# Patient Record
Sex: Male | Born: 1985 | Race: Black or African American | Hispanic: No | Marital: Single | Smoking: Current some day smoker
Health system: Southern US, Community
[De-identification: ages and names within clinical notes are randomized; demographics above are authoritative.]

## PROBLEM LIST (undated history)

## (undated) HISTORY — PX: NO PAST SURGERIES: SHX2092

---
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
## 2008-06-15 ENCOUNTER — Emergency Department: Payer: Self-pay | Admitting: Internal Medicine

## 2012-04-27 ENCOUNTER — Ambulatory Visit: Payer: Self-pay | Admitting: Family Medicine

## 2012-04-27 LAB — RAPID STREP-A WITH REFLX: Micro Text Report: NEGATIVE

## 2012-04-29 LAB — BETA STREP CULTURE(ARMC)

## 2012-09-19 ENCOUNTER — Ambulatory Visit: Payer: Self-pay

## 2013-04-08 ENCOUNTER — Inpatient Hospital Stay: Payer: Self-pay | Admitting: Internal Medicine

## 2013-04-08 LAB — CBC
HGB: 15.9 g/dL (ref 13.0–18.0)
MCH: 30.7 pg (ref 26.0–34.0)
MCHC: 34.5 g/dL (ref 32.0–36.0)
RBC: 5.18 10*6/uL (ref 4.40–5.90)
WBC: 16.1 10*3/uL — ABNORMAL HIGH (ref 3.8–10.6)

## 2013-04-08 LAB — DRUG SCREEN, URINE
Amphetamines, Ur Screen: NEGATIVE (ref ?–1000)
Barbiturates, Ur Screen: NEGATIVE (ref ?–200)
Benzodiazepine, Ur Scrn: NEGATIVE (ref ?–200)
Cannabinoid 50 Ng, Ur ~~LOC~~: POSITIVE (ref ?–50)
Cocaine Metabolite,Ur ~~LOC~~: NEGATIVE (ref ?–300)
MDMA (Ecstasy)Ur Screen: NEGATIVE (ref ?–500)
Opiate, Ur Screen: NEGATIVE (ref ?–300)
Tricyclic, Ur Screen: NEGATIVE (ref ?–1000)

## 2013-04-08 LAB — BASIC METABOLIC PANEL
BUN: 12 mg/dL (ref 7–18)
Calcium, Total: 8.8 mg/dL (ref 8.5–10.1)
Chloride: 103 mmol/L (ref 98–107)
Co2: 30 mmol/L (ref 21–32)
Creatinine: 0.71 mg/dL (ref 0.60–1.30)
EGFR (Non-African Amer.): >60
Glucose: 94 mg/dL (ref 65–99)
Potassium: 4 mmol/L (ref 3.5–5.1)

## 2013-04-08 LAB — CK: CK, Total: 93 U/L (ref 35–232)

## 2013-04-08 LAB — TROPONIN I: Troponin-I: 0.09 ng/mL — ABNORMAL HIGH

## 2013-04-08 LAB — CK-MB: CK-MB: 1.8 ng/mL (ref 0.5–3.6)

## 2013-04-09 LAB — CBC WITH DIFFERENTIAL/PLATELET
Basophil #: 0 10*3/uL (ref 0.0–0.1)
Eosinophil #: 0 10*3/uL (ref 0.0–0.7)
HCT: 45.5 % (ref 40.0–52.0)
HGB: 15.9 g/dL (ref 13.0–18.0)
MCHC: 34.8 g/dL (ref 32.0–36.0)
Monocyte #: 0.1 x10 3/mm — ABNORMAL LOW (ref 0.2–1.0)
Monocyte %: 0.7 %
Neutrophil #: 8.8 10*3/uL — ABNORMAL HIGH (ref 1.4–6.5)
Neutrophil %: 91.9 %
Platelet: 166 10*3/uL (ref 150–440)
WBC: 9.6 10*3/uL (ref 3.8–10.6)

## 2013-04-09 LAB — COMPREHENSIVE METABOLIC PANEL
Albumin: 3.4 g/dL (ref 3.4–5.0)
Alkaline Phosphatase: 53 U/L
BUN: 11 mg/dL (ref 7–18)
Calcium, Total: 8.6 mg/dL (ref 8.5–10.1)
Chloride: 107 mmol/L (ref 98–107)
Co2: 28 mmol/L (ref 21–32)
Creatinine: 0.76 mg/dL (ref 0.60–1.30)
EGFR (Non-African Amer.): >60
Glucose: 151 mg/dL — ABNORMAL HIGH (ref 65–99)
Osmolality: 278 (ref 275–301)
Potassium: 4.4 mmol/L (ref 3.5–5.1)
SGOT(AST): 21 U/L (ref 15–37)
Sodium: 138 mmol/L (ref 136–145)
Total Protein: 7 g/dL (ref 6.4–8.2)

## 2013-04-09 LAB — CK TOTAL AND CKMB (NOT AT ARMC)
CK, Total: 81 U/L (ref 35–232)
CK, Total: 90 U/L (ref 35–232)
CK-MB: 1 ng/mL (ref 0.5–3.6)
CK-MB: 0.8 ng/mL (ref 0.5–3.6)

## 2013-04-09 LAB — TROPONIN I: Troponin-I: 0.06 ng/mL — ABNORMAL HIGH

## 2014-03-15 ENCOUNTER — Ambulatory Visit: Payer: Self-pay | Admitting: Emergency Medicine

## 2014-08-03 NOTE — Consult Note (Signed)
PATIENT NAME:  Thomas CanterburyBLACKWELL, Pattrick J MR#:  [id number] DATE OF BIRTH:  [birth year]  DATE OF CONSULTATION:  04/08/2013  REFERRING PHYSICIAN:   CONSULTING PHYSICIAN:  Adah Salvageichard E. Excell Seltzerooper, MD  CHIEF COMPLAINT:  Left chest pain anteriorly.   HISTORY OF PRESENT ILLNESS:  I was called by Dr. Judithann SheenSparks to see this patient who had a pneumothorax identified on CT scan.  History was obtained from Dr. Judithann SheenSparks and from the patient.  The patient describes the acute onset of left chest pain anteriorly, points near his sternal border stating that it started yesterday while smoking cigarettes.  He states he has had something like this before, every year this time of year he states he has this sort of pain, but never this bad and in fact his mother corrects him and states that he had been diagnosed as bronchitis the last time this happened.  He denies sputum production and denies active shortness of breath at this time.   PAST MEDICAL HISTORY:  None.   PAST SURGICAL HISTORY:  None.   ALLERGIES:  None.   MEDICATIONS:  None.   FAMILY HISTORY:  Noncontributory.   SOCIAL HISTORY:  The patient smokes cigarettes, but does not drink.  He works at [employer].   REVIEW OF SYSTEMS:  A 10 system review is performed and negative with the exception of that mentioned in the HPI.   PHYSICAL EXAMINATION: GENERAL:  Comfortable-appearing tall, thin black male.  VITAL SIGNS:  Stable.  HEENT:  Shows no scleral icterus.  NECK:  No palpable neck nodes.  CHEST:  Clear to auscultation, good breath sounds in all lung fields both anteriorly and posteriorly.  ABDOMEN:  Soft, nontender.  CARDIAC:  Regular rate and rhythm.  EXTREMITIES:  Without edema.  NEUROLOGIC:  Grossly intact.  INTEGUMENT:  No jaundice.   LABORATORY DATA:  Chest x-ray and CT scan are personally reviewed, what is not visible on this chest x-ray is visible on the CT scan, anteriorly on the left is a very small rim of pneumothorax.  Multiple small blebs are noted.   Laboratory values are reviewed.   ASSESSMENT AND PLAN:  Small left pneumothorax, would not benefit from chest tube insertion at this point.  We will observe and repeat chest x-ray in the morning.  I discussed this with Dr. Judithann SheenSparks and with the family.  The  patient understood and agreed with this plan.   ____________________________ Adah Salvageichard E. Excell Seltzerooper, MD rec:ea D: 04/08/2013 23:53:28 ET T: 04/09/2013 03:02:09 ET JOB#: [id number]  cc: Adah Salvageichard E. Excell Seltzerooper, MD, <Dictator> Lattie HawICHARD E Gael Londo MD ELECTRONICALLY SIGNED 04/09/2013 6:43

## 2014-08-03 NOTE — Consult Note (Signed)
Brief Consult Note: Diagnosis: lt ptx.   Patient was seen by consultant.   Consult note dictated.   Recommend further assessment or treatment.   Orders entered.   Discussed with Attending MD.   Comments: Small lt ptx not visible on plain cxr, seen only on ct anteriorly. multiple small blebs. no prior episode. observe, repeat CXR and use suppl O2.  Electronic Signatures: Lattie Hawooper, Richard E (MD)  (Signed 27-Dec-14 23:48)  Authored: Brief Consult Note   Last Updated: 27-Dec-14 23:48 by Lattie Hawooper, Richard E (MD)

## 2014-08-03 NOTE — H&P (Signed)
PATIENT NAME:  [name], [name] MR#:  [id number] DATE OF BIRTH:  12-20-1985  DATE OF ADMISSION:  04/08/2013  REFERRING PHYSICIAN:  Dr. Mayford KnifeWilliams.   FAMILY PHYSICIAN:  None.   REASON FOR ADMISSION:  Chest pain.   HISTORY OF PRESENT ILLNESS:  The patient is a 29 year old male with no significant past medical history who presents with a two-day history of progressive chest pain, worse with deep inspiration.  Denies trauma.  Does have a family history of coronary artery disease.  In the Emergency Room, the patient was noted to have an abnormal EKG with an elevated troponin.  He is now admitted for further evaluation.   PAST MEDICAL HISTORY:  Unremarkable.   MEDICATIONS:  None.   ALLERGIES:  No known drug allergies.   SOCIAL HISTORY:  The patient does smoke cigarettes.  Denies alcohol abuse.  Denies illicit drug use.   FAMILY HISTORY:  Positive for coronary artery disease and diabetes, but otherwise unremarkable.   REVIEW OF SYSTEMS:  CONSTITUTIONAL:  No fever or change in weight.  EYES:  No blurred or double vision.  No glaucoma.  EARS, NOSE, THROAT:  No tinnitus or hearing loss.  No nasal discharge or bleeding.  No difficulty swallowing.  RESPIRATORY:  No cough or wheezing.  Denies hemoptysis.  CARDIOVASCULAR:  No orthopnea or palpitations.  No syncope.  GASTROINTESTINAL:  No nausea, vomiting, or diarrhea.  No abdominal pain.  No change in bowel habits.  GENITOURINARY:  No dysuria or hematuria.  No incontinence.  ENDOCRINE:  No polyuria or polydipsia.  No heat or cold intolerance.  HEMATOLOGIC:  The patient denies anemia, easy bruising, or bleeding.  LYMPHATIC:  No swollen glands.  MUSCULOSKELETAL:  The patient denies pain in his neck, back, shoulders, knees, or hips.  No gout.  NEUROLOGIC:  No numbness or weakness.  Denies migraines, stroke or seizures.  PSYCHIATRIC:  The patient denies anxiety, insomnia or depression.   PHYSICAL EXAMINATION: GENERAL:  The patient is in no acute  distress.  VITAL SIGNS:  Remarkable for a blood pressure of 155/92, with a heart rate of 79, respiratory rate of 18.  Temperature of 98.1.  HEENT:  Normocephalic, atraumatic.  Pupils equally round and reactive to light and accommodation.  Extraocular movements are intact.  Sclerae are anicteric.  Conjunctivae are clear.  Oropharynx is clear.  NECK:  Supple without JVD or bruits.  No adenopathy or thyromegaly is noted.  LUNGS:  Clear to auscultation and percussion without wheezes, rales or rhonchi.  No dullness.  Respiratory effort is normal.  CARDIAC:  Regular rate and rhythm with normal S1, S2.  There is a 2 over 6 systolic murmur noted throughout the precordium.  No rubs or gallops are present.  PMI is nondisplaced.  Chest wall is nontender.  ABDOMEN:  Soft, nontender with normoactive bowel sounds.  No organomegaly or masses were appreciated.  No hernias or bruits were noted.  EXTREMITIES:  Without clubbing, cyanosis, edema.  Pulses were 2+ bilaterally.  SKIN:  Warm and dry without rash or lesions.  NEUROLOGIC:  Cranial nerves II through XII grossly intact.  Deep tendon reflexes were symmetric.  Motor and sensory exam is nonfocal.  PSYCHIATRIC:  Revealed a patient who is alert and oriented to person, place and time.  He was cooperative and used good judgment.   LABORATORY DATA:  Chest x-ray was unremarkable.  EKG revealed sinus rhythm with diffuse ST elevation consistent with early repolarization versus pericarditis.  Sed rate was 1.  White  count was 16.1 with a hemoglobin of 15.9.  Troponin was 0.09 with a total CK of 93 and an MB of 1.8.  Glucose 94 with a BUN of 12, creatinine 0.71, sodium 137 and a potassium of 4.0.   ASSESSMENT: 1.  Chest pain associated with shortness of breath.  2.  Abnormal EKG.  3.  Elevated troponin.  4.  Family history of coronary artery disease.  5.  Borderline hypertension.   PLAN:  The patient will be admitted to telemetry with empiric IV fluids, IV steroids, IV  antibiotics and topical nitrates.  We will obtain a CT of the chest at this time before he gets moved to the floor.  We will follow serial cardiac enzymes and obtain a cardiology consult.  We will also perform an echocardiogram.  Follow up routine labs in the morning.  Vital signs q. 4 hours.  Further treatment and evaluation will depend upon the patient's progress.   Total time spent on this patient was 50 minutes.    ____________________________ Duane Lope Judithann Sheen, MD jds:ea D: 04/08/2013 21:51:07 ET T: 04/08/2013 23:22:21 ET JOB#: [id number]  cc: Duane Lope. Judithann Sheen, MD, <Dictator> Colandra Ohanian Rodena Medin MD ELECTRONICALLY SIGNED 04/09/2013 0:43

## 2014-08-04 NOTE — Consult Note (Signed)
PATIENT NAME:  [name], [name] DATE OF BIRTH:  06/08/1985  DATE OF CONSULTATION:  04/09/2013  CONSULTING PHYSICIAN:  Ziyad Dyar D. Juliann Paresallwood, MD  REFERRING PHYSICIAN: Dr. Mayford KnifeWilliams  INDICATION: Chest pain, possible pericarditis.   HISTORY OF PRESENT ILLNESS: The patient is a 29 year old black male with significant past medical history of two days of progressive chest pain, worse with deep inspiration and lying down. The patient denies any trauma. Does have some history of coronary artery disease came to the Emergency Room. Also had an abnormal EKG and then was subsequently advised to be admitted for further evaluation and monitoring. The patient did have an abnormal EKG, and slightly elevated troponin, admitted for possible sleep apnea.   PAST MEDICAL HISTORY: Unremarkable.   MEDICATIONS: None.   ALLERGIES: None.   SOCIAL HISTORY: No smoking or alcohol consumption. No illicit drug use.   FAMILY HISTORY: Positive for coronary artery disease and diabetes.  Hypertension.   REVIEW OF SYSTEMS: No blackouts, syncope. No nausea or vomiting. No fever, chills, sweats. No weight loss, weight gain, hemoptysis, hematemesis. No bright red blood per rectum. No vision or change in sputum production or cough.   PHYSICAL EXAMINATION: VITAL SIGNS: Blood pressure 150/90, pulse 80, respiratory rate 16, afebrile.  HEENT: Normocephalic, atraumatic. Pupils equal to light.  NECK: Supple. No JVD.  LUNGS: Clear to auscultation and percussion.  wheeze, rhonchi, rale. HEART: Regular rate and rhythm. Systolic ejection murmur left sternal border. No S4. No S3. No significant rales.  ABDOMEN: Positive bowel sounds. No rebound, guarding or tenderness.  EXTREMITIES: Within normal limits. No cyanosis, clubbing, or edema.  NEUROLOGIC: Intact.  SKIN: Normal.   LABORATORIES:   Chest x-ray possible mild pneumothorax.  EKG: Normal sinus rhythm, diffuse ST changes,   Sedimentation rate was 1, white  count of 16, hemoglobin of 59. Troponin 0.09, CK and MB were normal. Glucose 94, BUN of 12, creatinine of 0.71, sodium 137, potassium 4.0.   ASSESSMENT: Abnormal EKG, possible pericarditis, chest pain, elevated troponin, possible pneumothorax.    PLAN: Agree with admit to telemetry. Rule out for myocardial infarction. Follow up cardiac enzymes. Follow-up EKG. Recommend pain control, IV fluids. Recommend NSAIDs not necessarily would recommend steroids at this point. Echocardiogram will be helpful with evaluation pericarditis , do not recommend cardiac catheterization. Do not recommend invasive study. Recommend evaluation for pneumothorax, which appears to be spontaneous. Would recommend just pain control with Tylenol and NSAIDS for now. Have the patient followup with cardiologist in outpatient provided troponins stay reasonably low.     ____________________________ Bobbie Stackwayne D. Juliann Paresallwood, MD ddc:sg D: 04/10/2013 14:18:16 ET T: 04/10/2013 14:46:01 ET JOB#: [id number]  cc: Kj Imbert D. Juliann Paresallwood, MD, <Dictator> Alwyn PeaWAYNE D Latresha Yahr MD ELECTRONICALLY SIGNED 05/11/2013 16:20

## 2014-08-04 NOTE — Discharge Summary (Signed)
PATIENT NAME:  [name] MR#:  [id number] DATE OF BIRTH:  Jan 14, 1986  DATE OF ADMISSION:  04/08/2013 DATE OF DISCHARGClyde Canterbury:  04/09/2013  ADMITTING DIAGNOSIS: Chest pain.   DISCHARGE DIAGNOSES:  1.  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 2.  Cannabis and tobacco abuse.  3.  Elevated troponin, no acute coronary syndrome.  4.  Hyperglycemia.  5.  Elevated blood pressure with no diagnosis of hypertension, likely pain related.   DISCHARGE CONDITION: Stable.   DISCHARGE MEDICATIONS: Nitroglycerin 0.4 mg sublingually every 5 minutes as needed,  aspirin 81 mg p.o. daily, Combivent Respimat 1 puff 4 times daily as needed, prednisone 50 mg p.o. once on the 29th of December 2014, then taper by 10 mg daily until stopped,  Nicotine  oral inhaler one puff every 1 to 2 hours as needed, nicotine transdermal patch 14 mg topically daily and Zithromax 500 mg p.o. daily for 5 more days.   HOME OXYGEN: None.   DIET: 2 gram salt, low fat, low cholesterol. Regular consistency.    DISCHARGE ACTIVITY: As tolerated.    FOLLOWUP APPOINTMENTS: With pulmonary in 2 days after discharge. Cardiologist, Dr. Juliann Paresallwood in 2 days after discharge as well as Dr. Excell Seltzerooper in 2 days after discharge.   CONSULTANTS: Dr. Excell Seltzerooper and Dr. Michela PitcherEly, care management and social work.   RADIOLOGIC STUDIES: Chest x-ray, PA and lateral, the 22nd of December 2014, revealed normal and stable examination. CT angiogram revealed chest with rule out pulmonary embolism on the 27th of December 2014, showed no evidence of pulmonary embolus. Small left-sided pneumothorax was seen. Scattered blebs were noted within both lungs. Chest x-ray, PA and lateral, the 28th of  December 2014, revealed persistent small left apical pneumothorax, which was unchanged.   HOSPITAL COURSE: The patient is a 29 year old African American male with history of tobacco abuse, who  presented to the hospital with complaints of chest pain. Please refer to Dr. Judithann SheenSparks admission note on the 27th of December 2014. On arrival to the hospital, the patient was complaining of left-sided chest pain. Denied any trauma. It was worse with inspiration. The patient's vital signs were remarkable for blood pressure 155/92, heart rate was 79, respiration rate was 18 and temperature was 98.1. Physical exam was unremarkable. The patient's lab data done on admission on the 27th of December, 2014, revealed normal BMP. The patient's cardiac enzymes showed elevation of troponin to 0.09. Urine drug screen was positive for cannabinoids. White blood cell count was elevated to 16.1, hemoglobin was 15.9 and platelet count was 153. Erythrocyte sedimentation rate was normal at 1. The patient had a chest x-ray done and was admitted to the hospital for further evaluation. Consultation with Dr. Excell Seltzerooper surgeon was obtained on the 27th of December, 2014, due to a small left pneumothorax. According to Dr. Excell Seltzerooper, who saw the patient the same day, the 27th of December, there was a small left pneumothorax, which was not visible on plain chest x-ray; however, seen on CT with multiple small dark blood. He recommended to observe the patient and repeat chest x-ray in the morning. Continue supplemental oxygen therapy for now if needed. The patient had a repeated chest x-ray done the 28th of December, 2014. He was referred by Dr. Michela PitcherEly, who felt that patient's lungs did not show any further collapse and that was discussed with the patient as well as family as well as advised smoking cessation. The patient is to  follow up with Dr. Michela Pitcher or Dr. Excell Seltzer in the next few days after discharge for further recommendations. Meanwhile, since he has significant labs, it was felt that the patient would benefit from Combivent, prednisone taper and  off the Zithromax. For his nicotine abuse, he was recommended nicotine replacement therapy and he was  agreeable. In regards to elevated troponin, the patient had echocardiogram done, which was read by Dr. Juliann Pares. Echocardiogram results however are not available yet at the time of dictation. The patient is to follow up with cardiologist in the next few days after discharge. It was felt that the patient's left-sided chest pain was related to small pneumothorax. On the day of discharge, the patient's vital signs were stable with temperature of 98.1, pulse was 74, respirations was 18, blood pressure 128/69, saturation was 96% to 99% on room air at rest.   TIME SPENT: 40 minutes on the patient.  ____________________________ Katharina Caper, MD rv:aw D: 04/09/2013 17:29:19 ET T: 04/10/2013 06:44:37 ET JOB#: [id number]  cc: Katharina Caper, MD, <Dictator> Dwayne D. Juliann Pares, MD DR. Olivia Canter MD ELECTRONICALLY SIGNED 04/24/2013 14:33

## 2015-01-17 ENCOUNTER — Encounter: Payer: Self-pay | Admitting: Emergency Medicine

## 2015-01-17 ENCOUNTER — Ambulatory Visit: Payer: 59

## 2015-01-17 ENCOUNTER — Ambulatory Visit
Admission: EM | Admit: 2015-01-17 | Discharge: 2015-01-17 | Disposition: A | Discharge status: Home / Self Care | Payer: 59 | Attending: Family Medicine | Admitting: Family Medicine

## 2015-01-17 DIAGNOSIS — J069 Acute upper respiratory infection, unspecified: Secondary | ICD-10-CM

## 2015-01-17 MED ORDER — HYDROCOD POLST-CPM POLST ER 10-8 MG/5ML PO SUER: 5.0000 mL | Freq: Two times a day (BID) | ORAL | Status: DC

## 2015-01-17 NOTE — ED Provider Notes (Signed)
CSN: [id number]     Arrival date & time 01/17/15  1636 History   First MD Initiated Contact with Patient 01/17/15 1714     Chief Complaint  Patient presents with  . Cough   (Consider location/radiation/quality/duration/timing/severity/associated sxs/prior Treatment) HPI    29 year old male who presents with a cough productive of mild green sputum and chest congestion and drainage for 2 days. So having some nasal drainage as well. He denies any fever but has had some chills.   History reviewed. No pertinent past medical history. Past Surgical History  Procedure Laterality Date  . No past surgeries     No family history on file. Social History  Substance Use Topics  . Smoking status: Former Games developer  . Smokeless tobacco: None  . Alcohol Use: Yes    Review of Systems  Constitutional: Positive for chills. Negative for fever, diaphoresis and fatigue.  HENT: Positive for congestion, postnasal drip, rhinorrhea, sneezing and sore throat.   Respiratory: Positive for cough.   All other systems reviewed and are negative.   Allergies  Review of patient's allergies indicates no known allergies.  Home Medications   Prior to Admission medications   Medication Sig Start Date End Date Taking? Authorizing Provider  chlorpheniramine-HYDROcodone (TUSSIONEX PENNKINETIC ER) 10-8 MG/5ML SUER Take 5 mLs by mouth 2 (two) times daily. 01/17/15   Lutricia Feil, PA-C   Meds Ordered and Administered this Visit  Medications - No data to display  BP 136/84 mmHg  Pulse 108  Temp(Src) 99.6 F (37.6 C) (Tympanic)  Resp 18  Ht 6' (1.829 m)  Wt 170 lb (77.111 kg)  BMI 23.05 kg/m2  SpO2 95% No data found.   Physical Exam  Constitutional: He is oriented to person, place, and time. He appears well-developed and well-nourished. No distress.  HENT:  Head: Normocephalic and atraumatic.  Right Ear: External ear normal.  Left Ear: External ear normal.  Mouth/Throat: Oropharynx is clear and moist.   Eyes: Pupils are equal, round, and reactive to light.  Neck: Neck supple.  Pulmonary/Chest: Effort normal and breath sounds normal.  Patient has some decreased sounds in the bases right more than left  Musculoskeletal: Normal range of motion. He exhibits no edema or tenderness.  Neurological: He is alert and oriented to person, place, and time.  Skin: Skin is warm and dry. He is not diaphoretic.  Psychiatric: He has a normal mood and affect. His behavior is normal. Judgment and thought content normal.  Nursing note and vitals reviewed.   ED Course  Procedures (including critical care time)  Labs Review Labs Reviewed - No data to display  Imaging Review Dg Chest 2 View  01/17/2015   CLINICAL DATA:  Productive cough for 2 days, congestion, former smoker  EXAM: CHEST  2 VIEW  COMPARISON:  04/09/2013  FINDINGS: Normal heart size, mediastinal contours, and pulmonary vascularity.  Lungs demonstrate mild chronic hyperinflation but are otherwise clear.  No pulmonary infiltrate, pleural effusion or pneumothorax.  LEFT apex pneumothorax seen on previous exam resolved.  Bones unremarkable.  IMPRESSION: No acute abnormalities.   Electronically Signed   By: Ulyses Southward M.D.   On: 01/17/2015 17:41     Visual Acuity Review  Right Eye Distance:   Left Eye Distance:   Bilateral Distance:    Right Eye Near:   Left Eye Near:    Bilateral Near:         MDM   1. URI, acute    Discharge Medication List as  of 01/17/2015  5:58 PM    START taking these medications   Details  chlorpheniramine-HYDROcodone (TUSSIONEX PENNKINETIC ER) 10-8 MG/5ML SUER Take 5 mLs by mouth 2 (two) times daily., Starting 01/17/2015, Until Discontinued, Print       Plan: 1. Test/x-ray results and diagnosis reviewed with patient 2. rx as per orders; risks, benefits, potential side effects reviewed with patient 3. Recommend supportive treatment with fluids rest. Flonase BID 4. F/u prn if symptoms worsen or don't  improve    Lutricia Feil, PA-C 01/17/15 1805

## 2015-01-17 NOTE — ED Notes (Signed)
Cough, chest congestion, drainage for 2 days

## 2015-01-17 NOTE — ED Notes (Signed)
Patient transported to X-ray 

## 2015-01-17 NOTE — Discharge Instructions (Signed)
Upper Respiratory Infection, Adult °Most upper respiratory infections (URIs) are a viral infection of the air passages leading to the lungs. A URI affects the nose, throat, and upper air passages. The most common type of URI is nasopharyngitis and is typically referred to as "the common cold." °URIs run their course and usually go away on their own. Most of the time, a URI does not require medical attention, but sometimes a bacterial infection in the upper airways can follow a viral infection. This is called a secondary infection. Sinus and middle ear infections are common types of secondary upper respiratory infections. °Bacterial pneumonia can also complicate a URI. A URI can worsen asthma and chronic obstructive pulmonary disease (COPD). Sometimes, these complications can require emergency medical care and may be life threatening.  °CAUSES °Almost all URIs are caused by viruses. A virus is a type of germ and can spread from one person to another.  °RISKS FACTORS °You may be at risk for a URI if:  °· You smoke.   °· You have chronic heart or lung disease. °· You have a weakened defense (immune) system.   °· You are very young or very old.   °· You have nasal allergies or asthma. °· You work in crowded or poorly ventilated areas. °· You work in health care facilities or schools. °SIGNS AND SYMPTOMS  °Symptoms typically develop 2-3 days after you come in contact with a cold virus. Most viral URIs last 7-10 days. However, viral URIs from the influenza virus (flu virus) can last 14-18 days and are typically more severe. Symptoms may include:  °· Runny or stuffy (congested) nose.   °· Sneezing.   °· Cough.   °· Sore throat.   °· Headache.   °· Fatigue.   °· Fever.   °· Loss of appetite.   °· Pain in your forehead, behind your eyes, and over your cheekbones (sinus pain). °· Muscle aches.   °DIAGNOSIS  °Your health care provider may diagnose a URI by: °· Physical exam. °· Tests to check that your symptoms are not due to  another condition such as: °· Strep throat. °· Sinusitis. °· Pneumonia. °· Asthma. °TREATMENT  °A URI goes away on its own with time. It cannot be cured with medicines, but medicines may be prescribed or recommended to relieve symptoms. Medicines may help: °· Reduce your fever. °· Reduce your cough. °· Relieve nasal congestion. °HOME CARE INSTRUCTIONS  °· Take medicines only as directed by your health care provider.   °· Gargle warm saltwater or take cough drops to comfort your throat as directed by your health care provider. °· Use a warm mist humidifier or inhale steam from a shower to increase air moisture. This may make it easier to breathe. °· Drink enough fluid to keep your urine clear or pale yellow.   °· Eat soups and other clear broths and maintain good nutrition.   °· Rest as needed.   °· Return to work when your temperature has returned to normal or as your health care provider advises. You may need to stay home longer to avoid infecting others. You can also use a face mask and careful hand washing to prevent spread of the virus. °· Increase the usage of your inhaler if you have asthma.   °· Do not use any tobacco products, including cigarettes, chewing tobacco, or electronic cigarettes. If you need help quitting, ask your health care provider. °PREVENTION  °The best way to protect yourself from getting a cold is to practice good hygiene.  °· Avoid oral or hand contact with people with cold   symptoms.   °· Wash your hands often if contact occurs.   °There is no clear evidence that vitamin C, vitamin E, echinacea, or exercise reduces the chance of developing a cold. However, it is always recommended to get plenty of rest, exercise, and practice good nutrition.  °SEEK MEDICAL CARE IF:  °· You are getting worse rather than better.   °· Your symptoms are not controlled by medicine.   °· You have chills. °· You have worsening shortness of breath. °· You have brown or red mucus. °· You have yellow or brown nasal  discharge. °· You have pain in your face, especially when you bend forward. °· You have a fever. °· You have swollen neck glands. °· You have pain while swallowing. °· You have white areas in the back of your throat. °SEEK IMMEDIATE MEDICAL CARE IF:  °· You have severe or persistent: °¨ Headache. °¨ Ear pain. °¨ Sinus pain. °¨ Chest pain. °· You have chronic lung disease and any of the following: °¨ Wheezing. °¨ Prolonged cough. °¨ Coughing up blood. °¨ A change in your usual mucus. °· You have a stiff neck. °· You have changes in your: °¨ Vision. °¨ Hearing. °¨ Thinking. °¨ Mood. °MAKE SURE YOU:  °· Understand these instructions. °· Will watch your condition. °· Will get help right away if you are not doing well or get worse. °  °This information is not intended to replace advice given to you by your health care provider. Make sure you discuss any questions you have with your health care provider. °  °Document Released: 09/23/2000 Document Revised: 08/14/2014 Document Reviewed: 07/05/2013 °Elsevier Interactive Patient Education ©2016 [employer] ° °Cough, Adult °A cough helps to clear your throat and lungs. A cough may last only 2-3 weeks (acute), or it may last longer than 8 weeks (chronic). Many different things can cause a cough. A cough may be a sign of an illness or another medical condition. °HOME CARE °· Pay attention to any changes in your cough. °· Take medicines only as told by your doctor. °¨ If you were prescribed an antibiotic medicine, take it as told by your doctor. Do not stop taking it even if you start to feel better. °¨ Talk with your doctor before you try using a cough medicine. °· Drink enough fluid to keep your pee (urine) clear or pale yellow. °· If the air is dry, use a cold steam vaporizer or humidifier in your home. °· Stay away from things that make you cough at work or at home. °· If your cough is worse at night, try using extra pillows to raise your head up higher while you  sleep. °· Do not smoke, and try not to be around smoke. If you need help quitting, ask your doctor. °· Do not have caffeine. °· Do not drink alcohol. °· Rest as needed. °GET HELP IF: °· You have new problems (symptoms). °· You cough up yellow fluid (pus). °· Your cough does not get better after 2-3 weeks, or your cough gets worse. °· Medicine does not help your cough and you are not sleeping well. °· You have pain that gets worse or pain that is not helped with medicine. °· You have a fever. °· You are losing weight and you do not know why. °· You have night sweats. °GET HELP RIGHT AWAY IF: °· You cough up blood. °· You have trouble breathing. °· Your heartbeat is very fast. °  °This information is not intended to replace   advice given to you by your health care provider. Make sure you discuss any questions you have with your health care provider. °  °Document Released: 12/11/2010 Document Revised: 12/19/2014 Document Reviewed: 06/06/2014 °Elsevier Interactive Patient Education ©2016 [employer] ° °

## 2016-09-27 ENCOUNTER — Emergency Department: Payer: Self-pay

## 2016-09-27 ENCOUNTER — Emergency Department
Admission: EM | Admit: 2016-09-27 | Discharge: 2016-09-27 | Disposition: A | Discharge status: Home / Self Care | Payer: Self-pay | Attending: Student in an Organized Health Care Education/Training Program | Admitting: Student in an Organized Health Care Education/Training Program

## 2016-09-27 ENCOUNTER — Encounter: Payer: Self-pay | Admitting: Emergency Medicine

## 2016-09-27 DIAGNOSIS — Z79899 Other long term (current) drug therapy: Secondary | ICD-10-CM | POA: Insufficient documentation

## 2016-09-27 DIAGNOSIS — Z87891 Personal history of nicotine dependence: Secondary | ICD-10-CM | POA: Insufficient documentation

## 2016-09-27 DIAGNOSIS — E860 Dehydration: Secondary | ICD-10-CM | POA: Insufficient documentation

## 2016-09-27 DIAGNOSIS — R42 Dizziness and giddiness: Secondary | ICD-10-CM | POA: Insufficient documentation

## 2016-09-27 DIAGNOSIS — R197 Diarrhea, unspecified: Secondary | ICD-10-CM

## 2016-09-27 DIAGNOSIS — I480 Paroxysmal atrial fibrillation: Secondary | ICD-10-CM | POA: Insufficient documentation

## 2016-09-27 DIAGNOSIS — R112 Nausea with vomiting, unspecified: Secondary | ICD-10-CM

## 2016-09-27 LAB — COMPREHENSIVE METABOLIC PANEL
ALBUMIN: 4.9 g/dL (ref 3.5–5.0)
ALK PHOS: 47 U/L (ref 38–126)
ALT: 13 U/L — AB (ref 17–63)
ANION GAP: 7 (ref 5–15)
AST: 18 U/L (ref 15–41)
BILIRUBIN TOTAL: 0.8 mg/dL (ref 0.3–1.2)
BUN: 12 mg/dL (ref 6–20)
CO2: 26 mmol/L (ref 22–32)
CREATININE: 0.72 mg/dL (ref 0.61–1.24)
Calcium: 9.2 mg/dL (ref 8.9–10.3)
Chloride: 106 mmol/L (ref 101–111)
GFR calc Af Amer: >60 mL/min (ref >60)
GFR calc non Af Amer: >60 mL/min (ref >60)
GLUCOSE: 109 mg/dL — AB (ref 65–99)
Potassium: 4 mmol/L (ref 3.5–5.1)
SODIUM: 139 mmol/L (ref 135–145)
TOTAL PROTEIN: 8.1 g/dL (ref 6.5–8.1)

## 2016-09-27 LAB — URINALYSIS, COMPLETE (UACMP) WITH MICROSCOPIC
Bacteria, UA: NONE SEEN
Bilirubin Urine: NEGATIVE
GLUCOSE, UA: NEGATIVE mg/dL
Ketones, ur: NEGATIVE mg/dL
Leukocytes, UA: NEGATIVE
NITRITE: NEGATIVE
PH: 8 (ref 5.0–8.0)
Protein, ur: NEGATIVE mg/dL
SPECIFIC GRAVITY, URINE: 1.011 (ref 1.005–1.030)
Squamous Epithelial / LPF: NONE SEEN

## 2016-09-27 LAB — CBC
HCT: 51.3 % (ref 40.0–52.0)
Hemoglobin: 17.9 g/dL (ref 13.0–18.0)
MCH: 31.1 pg (ref 26.0–34.0)
MCHC: 34.8 g/dL (ref 32.0–36.0)
MCV: 89.3 fL (ref 80.0–100.0)
PLATELETS: 198 10*3/uL (ref 150–440)
RBC: 5.75 MIL/uL (ref 4.40–5.90)
RDW: 13.4 % (ref 11.5–14.5)
WBC: 18.1 10*3/uL — ABNORMAL HIGH (ref 3.8–10.6)

## 2016-09-27 LAB — LIPASE, BLOOD: Lipase: 29 U/L (ref 11–51)

## 2016-09-27 LAB — TROPONIN I
Troponin I: <0.03 ng/mL (ref <0.03)
Troponin I: <0.03 ng/mL (ref <0.03)

## 2016-09-27 LAB — URINE DRUG SCREEN, QUALITATIVE (ARMC ONLY)
AMPHETAMINES, UR SCREEN: NOT DETECTED
Barbiturates, Ur Screen: NOT DETECTED
Benzodiazepine, Ur Scrn: NOT DETECTED
COCAINE METABOLITE, UR ~~LOC~~: NOT DETECTED
Cannabinoid 50 Ng, Ur ~~LOC~~: POSITIVE — AB
MDMA (ECSTASY) UR SCREEN: NOT DETECTED
METHADONE SCREEN, URINE: NOT DETECTED
OPIATE, UR SCREEN: NOT DETECTED
PHENCYCLIDINE (PCP) UR S: NOT DETECTED
Tricyclic, Ur Screen: NOT DETECTED

## 2016-09-27 MED ORDER — SODIUM CHLORIDE 0.9 % IV BOLUS (SEPSIS)
1000.0000 mL | Freq: Once | INTRAVENOUS | Status: AC
  Administered 2016-09-27: 1000 mL via INTRAVENOUS

## 2016-09-27 MED ORDER — PROMETHAZINE HCL 12.5 MG PO TABS: 12.5000 mg | ORAL_TABLET | Freq: Four times a day (QID) | ORAL | 0 refills | Status: DC | PRN

## 2016-09-27 MED ORDER — PROMETHAZINE HCL 25 MG/ML IJ SOLN
12.5000 mg | Freq: Once | INTRAMUSCULAR | Status: AC
  Administered 2016-09-27: 12.5 mg via INTRAVENOUS
  Filled 2016-09-27: qty 1

## 2016-09-27 MED ORDER — SODIUM CHLORIDE 0.9 % IV BOLUS (SEPSIS)
500.0000 mL | Freq: Once | INTRAVENOUS | Status: AC
  Administered 2016-09-27: 500 mL via INTRAVENOUS

## 2016-09-27 NOTE — ED Triage Notes (Addendum)
Pt to ED via POV for N/V/D and dizziness. Pt states that vomiting and diarrhea started today. Pt reports that he has vomited 20-30 times and has had about 5 episodes of diarrhea.. Pt states that when going from sitting to standing he gets dizzy and feel like he is going to pass out. Pt is hypertensive in triage.   Blood pressure when sitting 164/109, pulse 77 Blood pressure 166/108 when standing, pulse 68

## 2016-09-27 NOTE — ED Provider Notes (Signed)
[facility] Emergency Department Provider Note    None    (approximate)  I have reviewed the triage vital signs and the nursing notes.   HISTORY  Chief Complaint Dizziness and Emesis    HPI RIGGINS CISEK is a 31 y.o. male who presents with chief complaint of dizziness that followed as numerous episodes of nonbloody nonbilious nausea vomiting since with 5 episodes of diarrhea. Is also started this morning. Has not been able to keep anything down. No fevers at home. Denies any recent sick contacts. Denies any significant alcohol use last night. Denies any abdominal pain except for when he is actively vomiting. States only sitting down he does not have any dizziness but when he stands up he feels that the room is spinning around that is about to pass out.He numbness or tingling.   History reviewed. No pertinent past medical history. FMH: no sudden cardiac death Past Surgical History:  Procedure Laterality Date  . NO PAST SURGERIES     There are no active problems to display for this patient.     Prior to Admission medications   Medication Sig Start Date End Date Taking? Authorizing Provider  chlorpheniramine-HYDROcodone (TUSSIONEX PENNKINETIC ER) 10-8 MG/5ML SUER Take 5 mLs by mouth 2 (two) times daily. 01/17/15   Lutricia Feil, PA-C  promethazine (PHENERGAN) 12.5 MG tablet Take 1 tablet (12.5 mg total) by mouth every 6 (six) hours as needed for nausea or vomiting. 09/27/16   Willy Eddy, MD    Allergies Patient has no known allergies.    Social History Social History  Substance Use Topics  . Smoking status: Former Games developer  . Smokeless tobacco: Never Used  . Alcohol use Yes    Review of Systems Patient denies headaches, rhinorrhea, blurry vision, numbness, shortness of breath, chest pain, edema, cough, abdominal pain, nausea, vomiting, diarrhea, dysuria, fevers, rashes or hallucinations unless otherwise stated above in  HPI. ____________________________________________   PHYSICAL EXAM:  VITAL SIGNS: Vitals:   09/27/16 1830 09/27/16 1900  BP: (!) 160/105 139/90  Pulse: 83 71  Resp: 17 14  Temp:      Constitutional: Alert and oriented. Well appearing and in no acute distress. Eyes: Conjunctivae are normal.  Head: Atraumatic. Nose: No congestion/rhinnorhea. Mouth/Throat: Mucous membranes are moist.   Neck: No stridor. Painless ROM.  Cardiovascular: Normal rate, regular rhythm. Grossly normal heart sounds.  Good peripheral circulation. Respiratory: Normal respiratory effort.  No retractions. Lungs CTAB. Gastrointestinal: Soft and nontender. No distention. No abdominal bruits. No CVA tenderness. Genitourinary:  Musculoskeletal: No lower extremity tenderness nor edema.  No joint effusions. Neurologic:  CN- intact.  No facial droop, Normal FNF.  Normal heel to shin.  Sensation intact bilaterally. Normal speech and language. No gross focal neurologic deficits are appreciated. No gait instability. Skin:  Skin is warm, dry and intact. No rash noted. Psychiatric: Mood and affect are normal. Speech and behavior are normal.  ____________________________________________   LABS (all labs ordered are listed, but only abnormal results are displayed)  Results for orders placed or performed during the hospital encounter of 09/27/16 (from the past 24 hour(s))  Lipase, blood     Status: None   Collection Time: 09/27/16  2:07 PM  Result Value Ref Range   Lipase 29 11 - 51 U/L  Comprehensive metabolic panel     Status: Abnormal   Collection Time: 09/27/16  2:07 PM  Result Value Ref Range   Sodium 139 135 - 145 mmol/L  Potassium 4.0 3.5 - 5.1 mmol/L   Chloride 106 101 - 111 mmol/L   CO2 26 22 - 32 mmol/L   Glucose, Bld 109 (H) 65 - 99 mg/dL   BUN 12 6 - 20 mg/dL   Creatinine, Ser 1.300.72 0.61 - 1.24 mg/dL   Calcium 9.2 8.9 - 86.510.3 mg/dL   Total Protein 8.1 6.5 - 8.1 g/dL   Albumin 4.9 3.5 - 5.0 g/dL   AST  18 15 - 41 U/L   ALT 13 (L) 17 - 63 U/L   Alkaline Phosphatase 47 38 - 126 U/L   Total Bilirubin 0.8 0.3 - 1.2 mg/dL   GFR calc non Af Amer >60 >60 mL/min   GFR calc Af Amer >60 >60 mL/min   Anion gap 7 5 - 15  CBC     Status: Abnormal   Collection Time: 09/27/16  2:07 PM  Result Value Ref Range   WBC 18.1 (H) 3.8 - 10.6 K/uL   RBC 5.75 4.40 - 5.90 MIL/uL   Hemoglobin 17.9 13.0 - 18.0 g/dL   HCT 78.451.3 69.640.0 - 29.552.0 %   MCV 89.3 80.0 - 100.0 fL   MCH 31.1 26.0 - 34.0 pg   MCHC 34.8 32.0 - 36.0 g/dL   RDW 28.413.4 13.211.5 - 44.014.5 %   Platelets 198 150 - 440 K/uL  Troponin I     Status: None   Collection Time: 09/27/16  2:07 PM  Result Value Ref Range   Troponin I <0.03 <0.03 ng/mL  Urinalysis, Complete w Microscopic     Status: Abnormal   Collection Time: 09/27/16  6:25 PM  Result Value Ref Range   Color, Urine YELLOW (A) YELLOW   APPearance CLEAR (A) CLEAR   Specific Gravity, Urine 1.011 1.005 - 1.030   pH 8.0 5.0 - 8.0   Glucose, UA NEGATIVE NEGATIVE mg/dL   Hgb urine dipstick SMALL (A) NEGATIVE   Bilirubin Urine NEGATIVE NEGATIVE   Ketones, ur NEGATIVE NEGATIVE mg/dL   Protein, ur NEGATIVE NEGATIVE mg/dL   Nitrite NEGATIVE NEGATIVE   Leukocytes, UA NEGATIVE NEGATIVE   RBC / HPF 6-30 0 - 5 RBC/hpf   WBC, UA 0-5 0 - 5 WBC/hpf   Bacteria, UA NONE SEEN NONE SEEN   Squamous Epithelial / LPF NONE SEEN NONE SEEN   Mucous PRESENT   Urine Drug Screen, Qualitative ([facility] only)     Status: Abnormal   Collection Time: 09/27/16  6:25 PM  Result Value Ref Range   Tricyclic, Ur Screen NONE DETECTED NONE DETECTED   Amphetamines, Ur Screen NONE DETECTED NONE DETECTED   MDMA (Ecstasy)Ur Screen NONE DETECTED NONE DETECTED   Cocaine Metabolite,Ur [place] NONE DETECTED NONE DETECTED   Opiate, Ur Screen NONE DETECTED NONE DETECTED   Phencyclidine (PCP) Ur S NONE DETECTED NONE DETECTED   Cannabinoid 50 Ng, Ur [place] POSITIVE (A) NONE DETECTED   Barbiturates, Ur Screen NONE DETECTED NONE DETECTED    Benzodiazepine, Ur Scrn NONE DETECTED NONE DETECTED   Methadone Scn, Ur NONE DETECTED NONE DETECTED  Troponin I     Status: None   Collection Time: 09/27/16  6:25 PM  Result Value Ref Range   Troponin I <0.03 <0.03 ng/mL   ____________________________________________  EKG My review and personal interpretation at Time:   16:09 Indication: dizziness  Rate: 90  Rhythm: afibr Axis: normal Other: concave upwards st elevation in precordial leads without reciprocal depressions  My review and personal interpretation at Time:   18:07 Indication: dizziness  Rate: 70  Rhythm: sinus Axis: normal Other: concave upwards st elevation in precordial leads without reciprocal depressions most consistent with BER ________________________  ____________________________________________  RADIOLOGY  I personally reviewed all radiographic images ordered to evaluate for the above acute complaints and reviewed radiology reports and findings.  These findings were personally discussed with the patient.  Please see medical record for radiology report.  ____________________________________________   PROCEDURES  Procedure(s) performed:  Procedures    Critical Care performed: no ____________________________________________   INITIAL IMPRESSION / ASSESSMENT AND PLAN / ED COURSE  Pertinent labs & imaging results that were available during my care of the patient were reviewed by me and considered in my medical decision making (see chart for details).  DDX: gastritis, dehydration, acs, pericarditis, boerhaaves, substance abuse    LAQUINCY EASTRIDGE is a 31 y.o. who presents to the ED with nausea vomiting and dizziness as described above. Patient afebrile and clinically very well-appearing. He denies any chest pain. EKG is concerning with some concave upward ST segment elevations in the precordial leads likely secondary to benign early re-pole given his habitus and lack of chest pain but we'll further risk  stratify with a troponin. Patient also with evidence of A. fib which could contribute to his dizziness. We'll check blood work to evaluate for any evidence of collection with abnormality. His abdominal exam is soft and benign.  The patient will be placed on continuous pulse oximetry and telemetry for monitoring.  Laboratory evaluation will be sent to evaluate for the above complaints.     Clinical Course as of Sep 28 1911  Sun Sep 27, 2016  1736 Patient reassessed. In no acute distress. She is to be going in and out of some sort of paroxysmal A. fib. His admitting to using marijuana and alcohol last night. Initial troponin is negative. Patient feels significantly improved. We'll further risk stratify with repeat troponin.  [PR]  1909 Patient reassessed. Now in sinus rhythm. Denies any chest pain or shortness of breath. He is tolerating oral hydration. We'll give him a referral for follow-up with cardiology consulted for nausea. Encouraged to abstain from alcohol or any other substances.  Have discussed with the patient and available family all diagnostics and treatments performed thus far and all questions were answered to the best of my ability. The patient demonstrates understanding and agreement with plan.   [PR]    Clinical Course User Index [PR] Willy Eddy, MD     ____________________________________________   FINAL CLINICAL IMPRESSION(S) / ED DIAGNOSES  Final diagnoses:  Paroxysmal atrial fibrillation (HCC)  Nausea vomiting and diarrhea  Dehydration      NEW MEDICATIONS STARTED DURING THIS VISIT:  New Prescriptions   PROMETHAZINE (PHENERGAN) 12.5 MG TABLET    Take 1 tablet (12.5 mg total) by mouth every 6 (six) hours as needed for nausea or vomiting.     Note:  This document was prepared using Dragon voice recognition software and may include unintentional dictation errors.    Willy Eddy, MD 09/27/16 [phone]

## 2020-09-10 ENCOUNTER — Encounter: Payer: Self-pay | Admitting: Emergency Medicine

## 2020-09-10 ENCOUNTER — Other Ambulatory Visit: Payer: Self-pay

## 2020-09-10 ENCOUNTER — Emergency Department
Admission: EM | Admit: 2020-09-10 | Discharge: 2020-09-10 | Disposition: A | Discharge status: Home / Self Care | Payer: 59 | Attending: Emergency Medicine | Admitting: Emergency Medicine

## 2020-09-10 ENCOUNTER — Emergency Department: Payer: 59

## 2020-09-10 DIAGNOSIS — Y9302 Activity, running: Secondary | ICD-10-CM | POA: Insufficient documentation

## 2020-09-10 DIAGNOSIS — Z87891 Personal history of nicotine dependence: Secondary | ICD-10-CM | POA: Insufficient documentation

## 2020-09-10 DIAGNOSIS — Y9289 Other specified places as the place of occurrence of the external cause: Secondary | ICD-10-CM | POA: Insufficient documentation

## 2020-09-10 DIAGNOSIS — S92534A Nondisplaced fracture of distal phalanx of right lesser toe(s), initial encounter for closed fracture: Secondary | ICD-10-CM

## 2020-09-10 DIAGNOSIS — W228XXA Striking against or struck by other objects, initial encounter: Secondary | ICD-10-CM | POA: Insufficient documentation

## 2020-09-10 MED ORDER — NAPROXEN 500 MG PO TABS: 500.0000 mg | ORAL_TABLET | Freq: Two times a day (BID) | ORAL | Status: AC

## 2020-09-10 NOTE — Discharge Instructions (Signed)
Read and follow discharge care read and follow discharge care instructions.  Keep toes buddy taped for least 2 weeks.
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
## 2021-04-06 ENCOUNTER — Telehealth: Payer: Self-pay | Admitting: Physician Assistant

## 2021-04-06 DIAGNOSIS — U071 COVID-19: Secondary | ICD-10-CM

## 2021-04-06 MED ORDER — PSEUDOEPH-BROMPHEN-DM 30-2-10 MG/5ML PO SYRP: 5.0000 mL | ORAL_SOLUTION | Freq: Four times a day (QID) | ORAL | 0 refills | Status: DC | PRN

## 2021-04-06 MED ORDER — FLUTICASONE PROPIONATE 50 MCG/ACT NA SUSP: 2.0000 | Freq: Every day | NASAL | 0 refills | Status: AC

## 2021-04-06 NOTE — Patient Instructions (Signed)
[name], thank you for joining Margaretann Loveless, PA-C for today's virtual visit.  While this provider is not your primary care provider (PCP), if your PCP is located in our provider database this encounter information will be shared with them immediately following your visit.  Consent: (Patient) [name] provided verbal consent for this virtual visit at the beginning of the encounter.  Current Medications:  Current Outpatient Medications:    brompheniramine-pseudoephedrine-DM 30-2-10 MG/5ML syrup, Take 5 mLs by mouth 4 (four) times daily as needed., Disp: 120 mL, Rfl: 0   fluticasone (FLONASE) 50 MCG/ACT nasal spray, Place 2 sprays into both nostrils daily., Disp: 16 g, Rfl: 0   naproxen (NAPROSYN) 500 MG tablet, Take 1 tablet (500 mg total) by mouth 2 (two) times daily with a meal., Disp: 20 tablet, Rfl: 00   Medications ordered in this encounter:  Meds ordered this encounter  Medications   brompheniramine-pseudoephedrine-DM 30-2-10 MG/5ML syrup    Sig: Take 5 mLs by mouth 4 (four) times daily as needed.    Dispense:  120 mL    Refill:  0    Order Specific Question:   Supervising Provider    Answer:   MILLER, BRIAN [[id number]]   fluticasone (FLONASE) 50 MCG/ACT nasal spray    Sig: Place 2 sprays into both nostrils daily.    Dispense:  16 g    Refill:  0    Order Specific Question:   Supervising Provider    Answer:   Hyacinth Meeker, BRIAN [[id number]]     *If you need refills on other medications prior to your next appointment, please contact your pharmacy*  Follow-Up: Call back or seek an in-person evaluation if the symptoms worsen or if the condition fails to improve as anticipated.  Other Instructions 10 Things You Can Do to Manage Your COVID-19 Symptoms at Home If you have possible or confirmed COVID-19 Stay home except to get medical care. Monitor your symptoms carefully. If your symptoms get worse, call your healthcare provider immediately. Get rest and stay  hydrated. If you have a medical appointment, call the healthcare provider ahead of time and tell them that you have or may have COVID-19. For medical emergencies, call 911 and notify the dispatch personnel that you have or may have COVID-19. Cover your cough and sneezes with a tissue or use the inside of your elbow. Wash your hands often with soap and water for at least 20 seconds or clean your hands with an alcohol-based hand sanitizer that contains at least 60% alcohol. As much as possible, stay in a specific room and away from other people in your home. Also, you should use a separate bathroom, if available. If you need to be around other people in or outside of the home, wear a mask. Avoid sharing personal items with other people in your household, like dishes, towels, and bedding. Clean all surfaces that are touched often, like counters, tabletops, and doorknobs. Use household cleaning sprays or wipes according to the label instructions. [email or web address] 10/27/2019 This information is not intended to replace advice given to you by your health care provider. Make sure you discuss any questions you have with your health care provider. Document Revised: 12/20/2020 Document Reviewed: 12/20/2020 Elsevier Patient Education  2022 [employer].    If you have been instructed to have an in-person evaluation today at a local Urgent Care facility, please use the link below. It will take you to a list of all of our available [facility], including address, phone number and hours of operation. Please do not delay care.  [facility]  If you or a family member do not have a primary care provider, use the link below to schedule a visit and establish care. When you choose a [facility] primary care physician or advanced practice provider, you gain a long-term partner in health. Find a Primary Care Provider  Learn more about [facility]'s in-office and virtual care  options:  - Get Care Now

## 2021-04-06 NOTE — Progress Notes (Signed)
Virtual Visit Consent   [name], you are scheduled for a virtual visit with a [facility] provider today.     Just as with appointments in the office, your consent must be obtained to participate.  Your consent will be active for this visit and any virtual visit you may have with one of our providers in the next 365 days.     If you have a MyChart account, a copy of this consent can be sent to you electronically.  All virtual visits are billed to your insurance company just like a traditional visit in the office.    As this is a virtual visit, video technology does not allow for your provider to perform a traditional examination.  This may limit your provider's ability to fully assess your condition.  If your provider identifies any concerns that need to be evaluated in person or the need to arrange testing (such as labs, EKG, etc.), we will make arrangements to do so.     Although advances in technology are sophisticated, we cannot ensure that it will always work on either your end or our end.  If the connection with a video visit is poor, the visit may have to be switched to a telephone visit.  With either a video or telephone visit, we are not always able to ensure that we have a secure connection.     I need to obtain your verbal consent now.   Are you willing to proceed with your visit today?    [name] has provided verbal consent on 04/06/2021 for a virtual visit (video or telephone).   Margaretann Loveless, PA-C   Date: 04/06/2021 5:46 PM   Virtual Visit via Video Note   I, Margaretann Loveless, connected with  [name]  ([id number], [birth year]) on 04/06/21 at  5:30 PM EST by a video-enabled telemedicine application and verified that I am speaking with the correct person using two identifiers.  Location: Patient: Virtual Visit Location Patient: Home Provider: Virtual Visit Location Provider: Home Office   I discussed the limitations of  evaluation and management by telemedicine and the availability of in person appointments. The patient expressed understanding and agreed to proceed.    History of Present Illness: [name] is a 35 y.o. who identifies as a male who was assigned male at birth, and is being seen today for Covid 69.  HPI: URI  This is a new problem. Episode onset: Symptoms started Wednesday night into Thursday; Tested positive for Covid 19 yesterday. The problem has been gradually worsening. Maximum temperature: subjective fevers. Associated symptoms include congestion, coughing, headaches and sinus pain. Pertinent negatives include no diarrhea, ear pain, nausea, plugged ear sensation, rhinorrhea, sore throat or vomiting. Associated symptoms comments: Post nasal drainage, fatigue, body aches. Treatments tried: ibuprofen, nyquil. The treatment provided mild relief.     Problems: There are no problems to display for this patient.   Allergies: No Known Allergies Medications:  Current Outpatient Medications:    brompheniramine-pseudoephedrine-DM 30-2-10 MG/5ML syrup, Take 5 mLs by mouth 4 (four) times daily as needed., Disp: 120 mL, Rfl: 0   fluticasone (FLONASE) 50 MCG/ACT nasal spray, Place 2 sprays into both nostrils daily., Disp: 16 g, Rfl: 0   naproxen (NAPROSYN) 500 MG tablet, Take 1 tablet (500 mg total) by mouth 2 (two) times daily with a meal., Disp: 20 tablet, Rfl: 00  Observations/Objective: Patient is well-developed, well-nourished in no acute distress.  Resting comfortably at home.  Head is normocephalic, atraumatic.  No labored breathing.  Speech is clear and coherent with logical content.  Patient is alert and oriented at baseline.    Assessment and Plan: 1. COVID-19 - brompheniramine-pseudoephedrine-DM 30-2-10 MG/5ML syrup; Take 5 mLs by mouth 4 (four) times daily as needed.  Dispense: 120 mL; Refill: 0 - fluticasone (FLONASE) 50 MCG/ACT nasal spray; Place 2 sprays into both nostrils  daily.  Dispense: 16 g; Refill: 0  - Continue OTC symptomatic management of choice - Will send OTC vitamins and supplement information through AVS - Bromfed DM and Fluticasone prescribed - Patient enrolled in MyChart symptom monitoring - Push fluids - Rest as needed - Discussed return precautions and when to seek in-person evaluation, sent via AVS as well   Follow Up Instructions: I discussed the assessment and treatment plan with the patient. The patient was provided an opportunity to ask questions and all were answered. The patient agreed with the plan and demonstrated an understanding of the instructions.  A copy of instructions were sent to the patient via MyChart unless otherwise noted below.    The patient was advised to call back or seek an in-person evaluation if the symptoms worsen or if the condition fails to improve as anticipated.  Time:  I spent 12 minutes with the patient via telehealth technology discussing the above problems/concerns.    Margaretann Loveless, PA-C

## 2021-04-09 ENCOUNTER — Encounter: Payer: Self-pay | Admitting: Physician Assistant

## 2021-04-18 ENCOUNTER — Ambulatory Visit: Payer: Self-pay

## 2021-04-19 ENCOUNTER — Ambulatory Visit (INDEPENDENT_AMBULATORY_CARE_PROVIDER_SITE_OTHER): Payer: Self-pay

## 2021-04-19 ENCOUNTER — Ambulatory Visit
Admission: RE | Admit: 2021-04-19 | Discharge: 2021-04-19 | Disposition: A | Discharge status: Home / Self Care | Payer: Self-pay | Source: Ambulatory Visit | Attending: Family Medicine | Admitting: Family Medicine

## 2021-04-19 VITALS — BP 141/92 | HR 82 | Temp 99.2°F | Resp 18

## 2021-04-19 DIAGNOSIS — R093 Abnormal sputum: Secondary | ICD-10-CM

## 2021-04-19 DIAGNOSIS — U071 COVID-19: Secondary | ICD-10-CM

## 2021-04-19 DIAGNOSIS — R059 Cough, unspecified: Secondary | ICD-10-CM

## 2021-04-19 DIAGNOSIS — R509 Fever, unspecified: Secondary | ICD-10-CM

## 2021-04-19 DIAGNOSIS — J22 Unspecified acute lower respiratory infection: Secondary | ICD-10-CM

## 2021-04-19 MED ORDER — PREDNISONE 20 MG PO TABS: 40.0000 mg | ORAL_TABLET | Freq: Every day | ORAL | 0 refills | Status: AC

## 2021-04-19 MED ORDER — PSEUDOEPH-BROMPHEN-DM 30-2-10 MG/5ML PO SYRP: 5.0000 mL | ORAL_SOLUTION | Freq: Four times a day (QID) | ORAL | 0 refills | Status: AC | PRN

## 2021-04-19 MED ORDER — ALBUTEROL SULFATE HFA 108 (90 BASE) MCG/ACT IN AERS: 1.0000 | INHALATION_SPRAY | Freq: Four times a day (QID) | RESPIRATORY_TRACT | 0 refills | Status: AC | PRN

## 2021-04-19 NOTE — Discharge Instructions (Signed)
I will update you via MyChart once your x-ray results. For now I am treating you with prednisone 40 mg once daily for 5 days for chest congestion and tightness suspicious for bronchitis. Promethazine DM for management of cough. Albuterol inhaler 2 puff every 4-6 hours as needed for shortness of breath or chest tigthness.

## 2021-04-19 NOTE — ED Triage Notes (Signed)
Pt presents with cough, HA and blood tinged mucus. Pt dx with Covid 2 weeks ago.

## 2021-04-19 NOTE — ED Provider Notes (Addendum)
[facility]    CSN: [id number] Arrival date & time: 04/19/21  1313      History   Chief Complaint Chief Complaint  Patient presents with   Cough   Headache    HPI [name] is a 36 y.o. male.   HPI Patient presents for evaluation of cough, headache, blood-tinged sputum .Patient is 2 weeks s/p COVID-19 infection. He had shortness of breath, however is still experiencing cough. He is a smoker. Denies prior history of asthma or bronchitis. Patient is current daily smoker. He has taken OTC medication without relief or improvement of cough.  History reviewed. No pertinent past medical history.  There are no problems to display for this patient.   Past Surgical History:  Procedure Laterality Date   NO PAST SURGERIES         Home Medications    Prior to Admission medications   Medication Sig Start Date End Date Taking? Authorizing Provider  brompheniramine-pseudoephedrine-DM 30-2-10 MG/5ML syrup Take 5 mLs by mouth 4 (four) times daily as needed. 04/06/21   Margaretann Loveless, PA-C  fluticasone (FLONASE) 50 MCG/ACT nasal spray Place 2 sprays into both nostrils daily. 04/06/21   Margaretann Loveless, PA-C  naproxen (NAPROSYN) 500 MG tablet Take 1 tablet (500 mg total) by mouth 2 (two) times daily with a meal. 09/10/20   Joni Reining, PA-C    Family History No family history on file.  Social History Social History   Tobacco Use   Smoking status: Some Days    Types: Cigars   Smokeless tobacco: Never  Vaping Use   Vaping Use: Never used  Substance Use Topics   Alcohol use: Yes   Drug use: Yes    Types: Marijuana     Allergies   Patient has no known allergies.   Review of Systems Review of Systems Pertinent negatives listed in HPI   Physical Exam Triage Vital Signs ED Triage Vitals  Enc Vitals Group     BP 04/19/21 1410 (!) 141/92     Pulse Rate 04/19/21 1410 82     Resp 04/19/21 1410 18     Temp 04/19/21 1410 99.2 F (37.3  C)     Temp Source 04/19/21 1410 Oral     SpO2 04/19/21 1410 96 %     Weight --      Height --      Head Circumference --      Peak Flow --      Pain Score 04/19/21 1412 0     Pain Loc --      Pain Edu? --      Excl. in GC? --    No data found.  Updated Vital Signs BP (!) 141/92 (BP Location: Left Arm)    Pulse 82    Temp 99.2 F (37.3 C) (Oral)    Resp 18    SpO2 96%   Visual Acuity Right Eye Distance:   Left Eye Distance:   Bilateral Distance:    Right Eye Near:   Left Eye Near:    Bilateral Near:     Physical Exam  General Appearance:    Alert, cooperative, no distress  HENT:   Normocephalic, ears normal, nares mucosal edema with congestion, rhinorrhea, oropharynx  patent w/o exudate or erythema  Eyes:    PERRL, conjunctiva/corneas clear, EOM's intact       Lungs:     Clear to auscultation bilaterally, respirations unlabored  Heart:    Regular  rate and rhythm  Neurologic:   Awake, alert, oriented x 3. No apparent focal neurological           defect.      UC Treatments / Results  Labs (all labs ordered are listed, but only abnormal results are displayed) Labs Reviewed - No data to display  EKG   Radiology No results found.  Procedures Procedures (including critical care time)  Medications Ordered in UC Medications - No data to display  Initial Impression / Assessment and Plan / UC Course  I have reviewed the triage vital signs and the nursing notes.  Pertinent labs & imaging results that were available during my care of the patient were reviewed by me and considered in my medical decision making (see chart for details).    Acute bronchitis secondary to recent COVID-19 infection CXR negative. Treatment with prednisone 40 mg x 5 days, promethazine DM, and albuterol. RTC if symptoms worsen or do not improve. Final Clinical Impressions(s) / UC Diagnoses   Final diagnoses:  Bronchitis due to COVID-19 virus   Discharge Instructions   None    ED  Prescriptions     Medication Sig Dispense Auth. Provider   brompheniramine-pseudoephedrine-DM 30-2-10 MG/5ML syrup Take 5 mLs by mouth 4 (four) times daily as needed. 180 mL Bing Neighbors, FNP   predniSONE (DELTASONE) 20 MG tablet Take 2 tablets (40 mg total) by mouth daily with breakfast for 5 days. 10 tablet Bing Neighbors, FNP   albuterol (VENTOLIN HFA) 108 (90 Base) MCG/ACT inhaler Inhale 1-2 puffs into the lungs every 6 (six) hours as needed for wheezing or shortness of breath. 1 each Bing Neighbors, FNP      PDMP not reviewed this encounter.   Bing Neighbors, FNP 04/20/21 1832    Bing Neighbors, FNP 04/20/21 [id number]

## 2022-03-09 ENCOUNTER — Ambulatory Visit
Admission: EM | Admit: 2022-03-09 | Discharge: 2022-03-09 | Disposition: A | Discharge status: Home / Self Care | Payer: 59 | Attending: Urgent Care | Admitting: Urgent Care

## 2022-03-09 ENCOUNTER — Encounter: Payer: Self-pay | Admitting: Emergency Medicine

## 2022-03-09 DIAGNOSIS — J0100 Acute maxillary sinusitis, unspecified: Secondary | ICD-10-CM

## 2022-03-09 DIAGNOSIS — H6690 Otitis media, unspecified, unspecified ear: Secondary | ICD-10-CM

## 2022-03-09 MED ORDER — PREDNISONE 20 MG PO TABS: ORAL_TABLET | ORAL | 0 refills | Status: AC

## 2022-03-09 MED ORDER — AZITHROMYCIN 250 MG PO TABS: ORAL_TABLET | ORAL | 0 refills | Status: AC

## 2022-03-09 NOTE — Discharge Instructions (Addendum)
Recommend you obtain Sudafed sinus (pseudoephedrine) or a generic equivalent from the pharmacy.  Asked the pharmacist for this over-the-counter medication.  Start the prescribed prednisone tomorrow.  Start the prescribed antibiotic tonight.  Follow up here or with your primary care provider if your symptoms are worsening or not improving.

## 2022-03-09 NOTE — ED Triage Notes (Signed)
Pt presents with bilateral ear pain x 4 days.

## 2022-03-09 NOTE — ED Provider Notes (Signed)
[facility]    CSN: [id number] Arrival date & time: 03/09/22  1635      History   Chief Complaint Chief Complaint  Patient presents with   Otalgia    HPI [name] is a 36 y.o. male.    Otalgia   Presents to urgent care with complaint of bilateral ear pain x 4 days.  He states he woke Thursday night with pain in his ears right more than left.  Feeling of filled with water.  He endorses sinus infection with pain and pressure around his eyes several days prior to this.  Reports today pain is worse in his left ear.  History reviewed. No pertinent past medical history.  There are no problems to display for this patient.   Past Surgical History:  Procedure Laterality Date   NO PAST SURGERIES         Home Medications    Prior to Admission medications   Medication Sig Start Date End Date Taking? Authorizing Provider  albuterol (VENTOLIN HFA) 108 (90 Base) MCG/ACT inhaler Inhale 1-2 puffs into the lungs every 6 (six) hours as needed for wheezing or shortness of breath. 04/19/21   Bing Neighbors, FNP  brompheniramine-pseudoephedrine-DM 30-2-10 MG/5ML syrup Take 5 mLs by mouth 4 (four) times daily as needed. 04/19/21   Bing Neighbors, FNP  fluticasone (FLONASE) 50 MCG/ACT nasal spray Place 2 sprays into both nostrils daily. 04/06/21   Margaretann Loveless, PA-C  naproxen (NAPROSYN) 500 MG tablet Take 1 tablet (500 mg total) by mouth 2 (two) times daily with a meal. 09/10/20   Joni Reining, PA-C    Family History History reviewed. No pertinent family history.  Social History Social History   Tobacco Use   Smoking status: Some Days    Types: Cigars   Smokeless tobacco: Never  Vaping Use   Vaping Use: Never used  Substance Use Topics   Alcohol use: Yes   Drug use: Yes    Types: Marijuana     Allergies   Patient has no known allergies.   Review of Systems Review of Systems  HENT:  Positive for ear pain.      Physical  Exam Triage Vital Signs ED Triage Vitals  Enc Vitals Group     BP 03/09/22 1721 (!) 155/103     Pulse Rate 03/09/22 1721 82     Resp 03/09/22 1721 16     Temp 03/09/22 1721 98.5 F (36.9 C)     Temp Source 03/09/22 1721 Oral     SpO2 03/09/22 1721 98 %     Weight --      Height --      Head Circumference --      Peak Flow --      Pain Score 03/09/22 1720 7     Pain Loc --      Pain Edu? --      Excl. in GC? --    No data found.  Updated Vital Signs BP (!) 155/103 (BP Location: Left Arm)   Pulse 82   Temp 98.5 F (36.9 C) (Oral)   Resp 16   SpO2 98%   Visual Acuity Right Eye Distance:   Left Eye Distance:   Bilateral Distance:    Right Eye Near:   Left Eye Near:    Bilateral Near:     Physical Exam Vitals reviewed.  Constitutional:      Appearance: Normal appearance.  HENT:  Ears:     Comments: Unable to clearly view TMs due to EAC edema.  TMs appear bright pink and possibly erythematous.    Nose:     Right Sinus: Maxillary sinus tenderness present.     Left Sinus: Maxillary sinus tenderness present.  Skin:    General: Skin is warm and dry.  Neurological:     General: No focal deficit present.     Mental Status: He is alert and oriented to person, place, and time.  Psychiatric:        Mood and Affect: Mood normal.        Behavior: Behavior normal.      UC Treatments / Results  Labs (all labs ordered are listed, but only abnormal results are displayed) Labs Reviewed - No data to display  EKG   Radiology No results found.  Procedures Procedures (including critical care time)  Medications Ordered in UC Medications - No data to display  Initial Impression / Assessment and Plan / UC Course  I have reviewed the triage vital signs and the nursing notes.  Pertinent labs & imaging results that were available during my care of the patient were reviewed by me and considered in my medical decision making (see chart for details).   TMs are  possibly erythematous bilaterally.  Unable to view clearly due to [facility] edema bilaterally.  Will treat for presumed bilateral otitis media.  Also will prescribe course of prednisone to relieve sinus inflammation and allow drainage of his eustachian tubes.   Final Clinical Impressions(s) / UC Diagnoses   Final diagnoses:  None   Discharge Instructions   None    ED Prescriptions   None    PDMP not reviewed this encounter.   Rose Phi, [place] 03/09/22 1835

## 2023-12-11 IMAGING — DX DG CHEST 2V
2 series · 2 of 2 positions shown · non-contrast
Comparison: 01/17/2015

CLINICAL DATA: Cough and fever.  Blood tinged sputum.

EXAM:
CHEST - 2 VIEW

[chest pa]
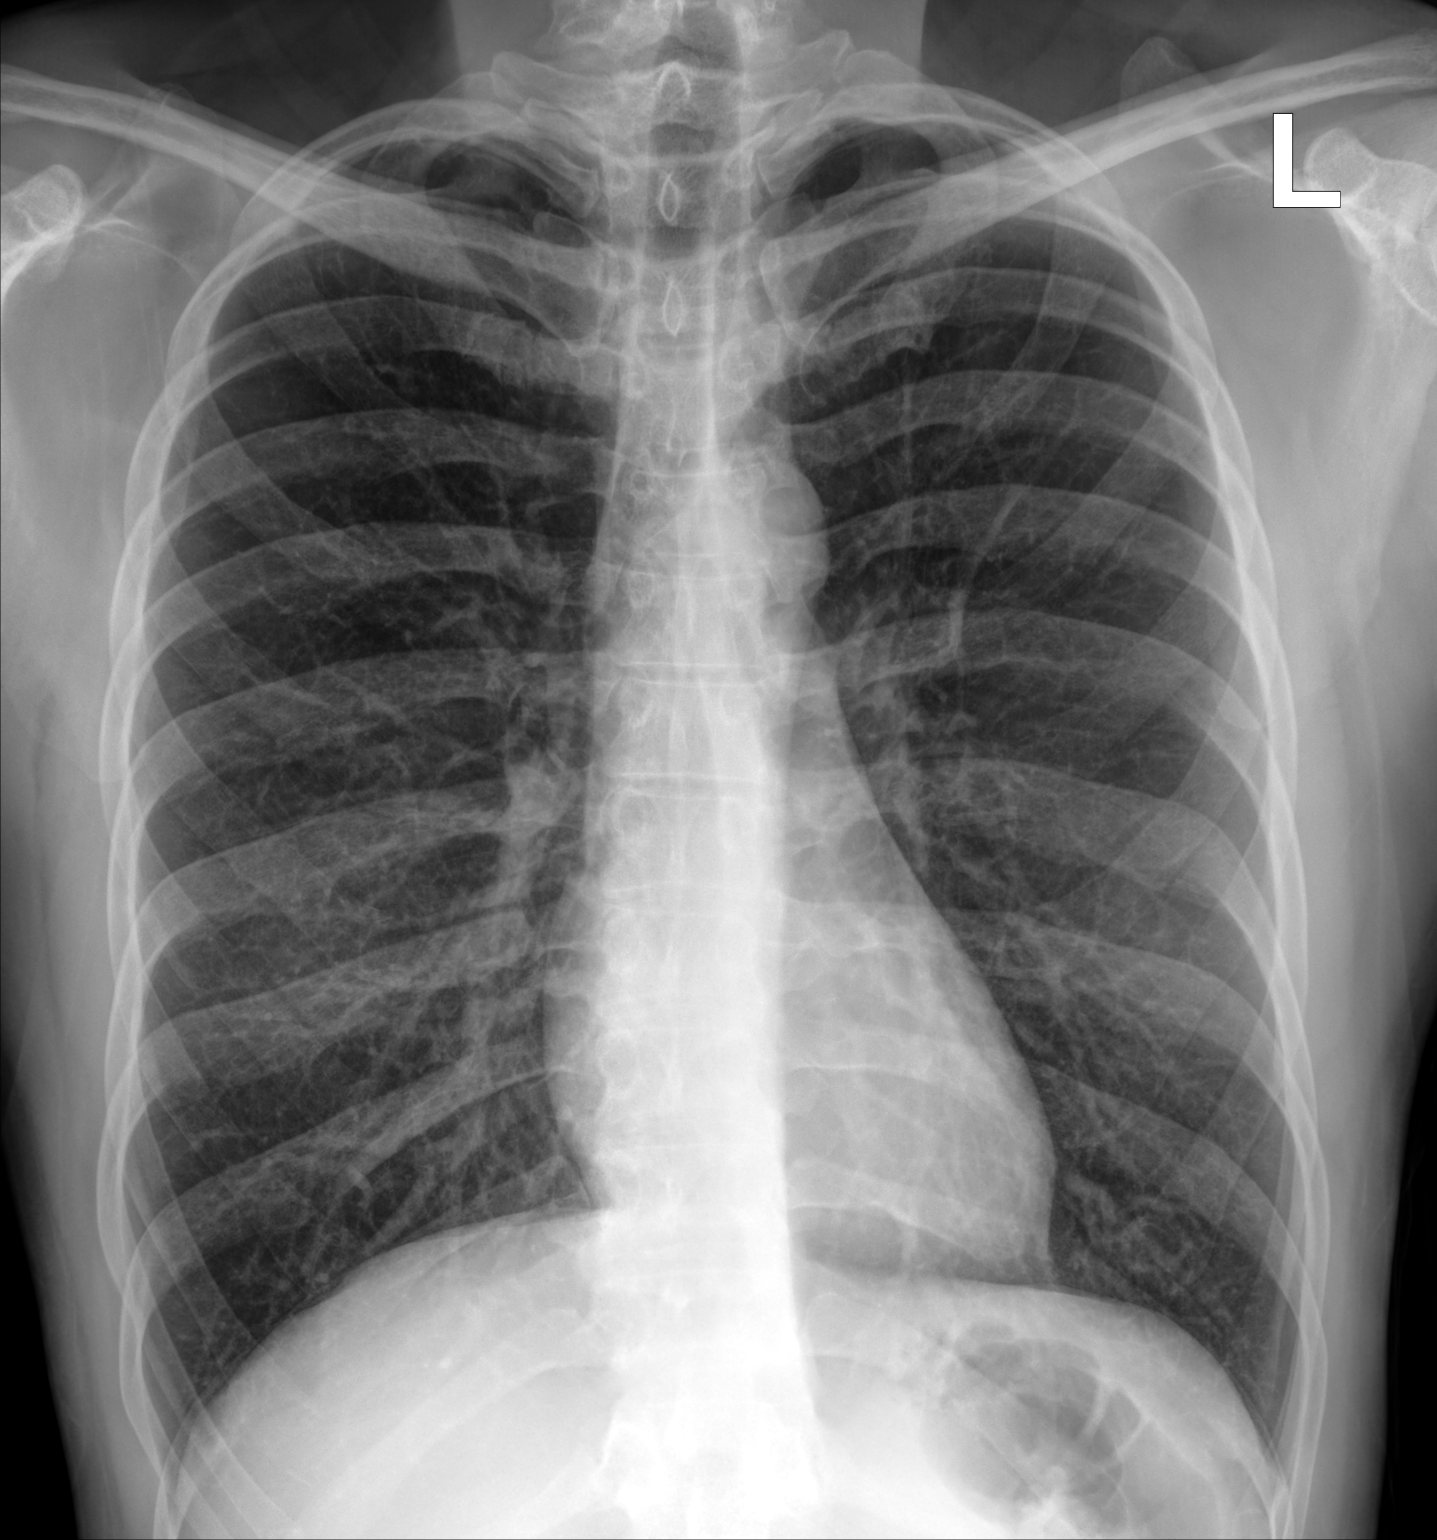

[chest lat]
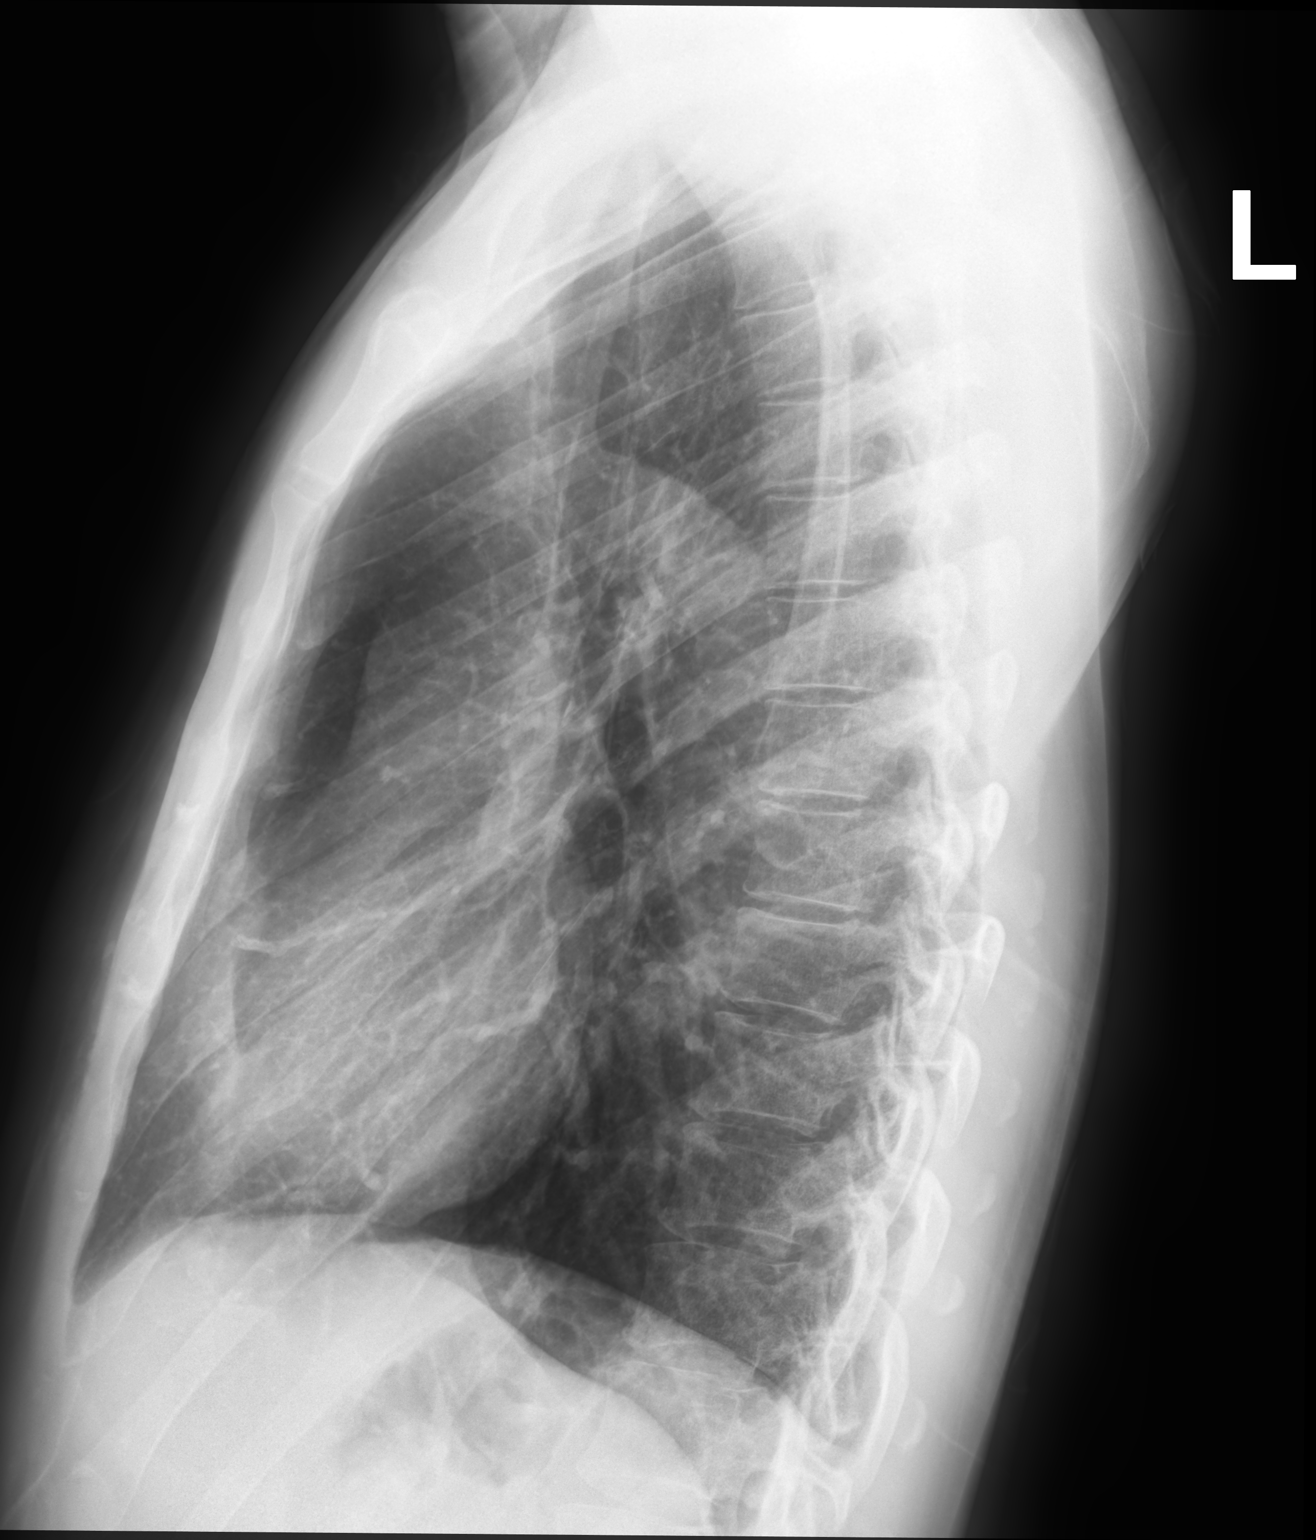

[2 of 2 positions shown; findings below may reference images not displayed]

FINDINGS: The heart size and mediastinal contours are within normal limits.
Both lungs are clear. The visualized skeletal structures are
unremarkable.
IMPRESSION: No active cardiopulmonary disease.
